# Patient Record
Sex: Male | Born: 1960 | Race: White | Hispanic: No | Marital: Married | Smoking: Current some day smoker
Health system: Southern US, Community
[De-identification: ages and names within clinical notes are randomized; demographics above are authoritative.]

## PROBLEM LIST (undated history)

## (undated) DIAGNOSIS — K219 Gastro-esophageal reflux disease without esophagitis: Secondary | ICD-10-CM

## (undated) DIAGNOSIS — IMO0001 Reserved for inherently not codable concepts without codable children: Secondary | ICD-10-CM

## (undated) DIAGNOSIS — K4090 Unilateral inguinal hernia, without obstruction or gangrene, not specified as recurrent: Secondary | ICD-10-CM

## (undated) DIAGNOSIS — R030 Elevated blood-pressure reading, without diagnosis of hypertension: Secondary | ICD-10-CM

## (undated) HISTORY — DX: Unilateral inguinal hernia, without obstruction or gangrene, not specified as recurrent: K40.90

## (undated) SURGERY — REPAIR, HERNIA, INGUINAL, LAPAROSCOPIC
Anesthesia: General | Laterality: Right

---
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
## 2005-07-18 ENCOUNTER — Emergency Department: Payer: Self-pay | Admitting: Unknown Physician Specialty

## 2006-05-19 ENCOUNTER — Emergency Department: Payer: Self-pay | Admitting: Emergency Medicine

## 2007-02-21 ENCOUNTER — Emergency Department: Payer: Self-pay | Admitting: Unknown Physician Specialty
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
## 2009-06-07 ENCOUNTER — Emergency Department: Payer: Self-pay | Admitting: Emergency Medicine

## 2009-12-08 ENCOUNTER — Emergency Department: Payer: Self-pay | Admitting: Emergency Medicine

## 2010-12-31 ENCOUNTER — Emergency Department: Payer: Self-pay | Admitting: Emergency Medicine
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
## 2012-06-18 IMAGING — CR DG CHEST 1V PORT
1 series · 1 of 1 positions shown · non-contrast
Comparison: none

REASON FOR EXAM: Chest Pain
COMMENTS:

PROCEDURE:     DXR - DXR PORTABLE CHEST SINGLE VIEW  - December 31, 2010  [DATE]
RESULT:     The lungs are clear. The cardiovascular structures are
unremarkable. The chest is stable from 02/21/2007.

[view not recorded]
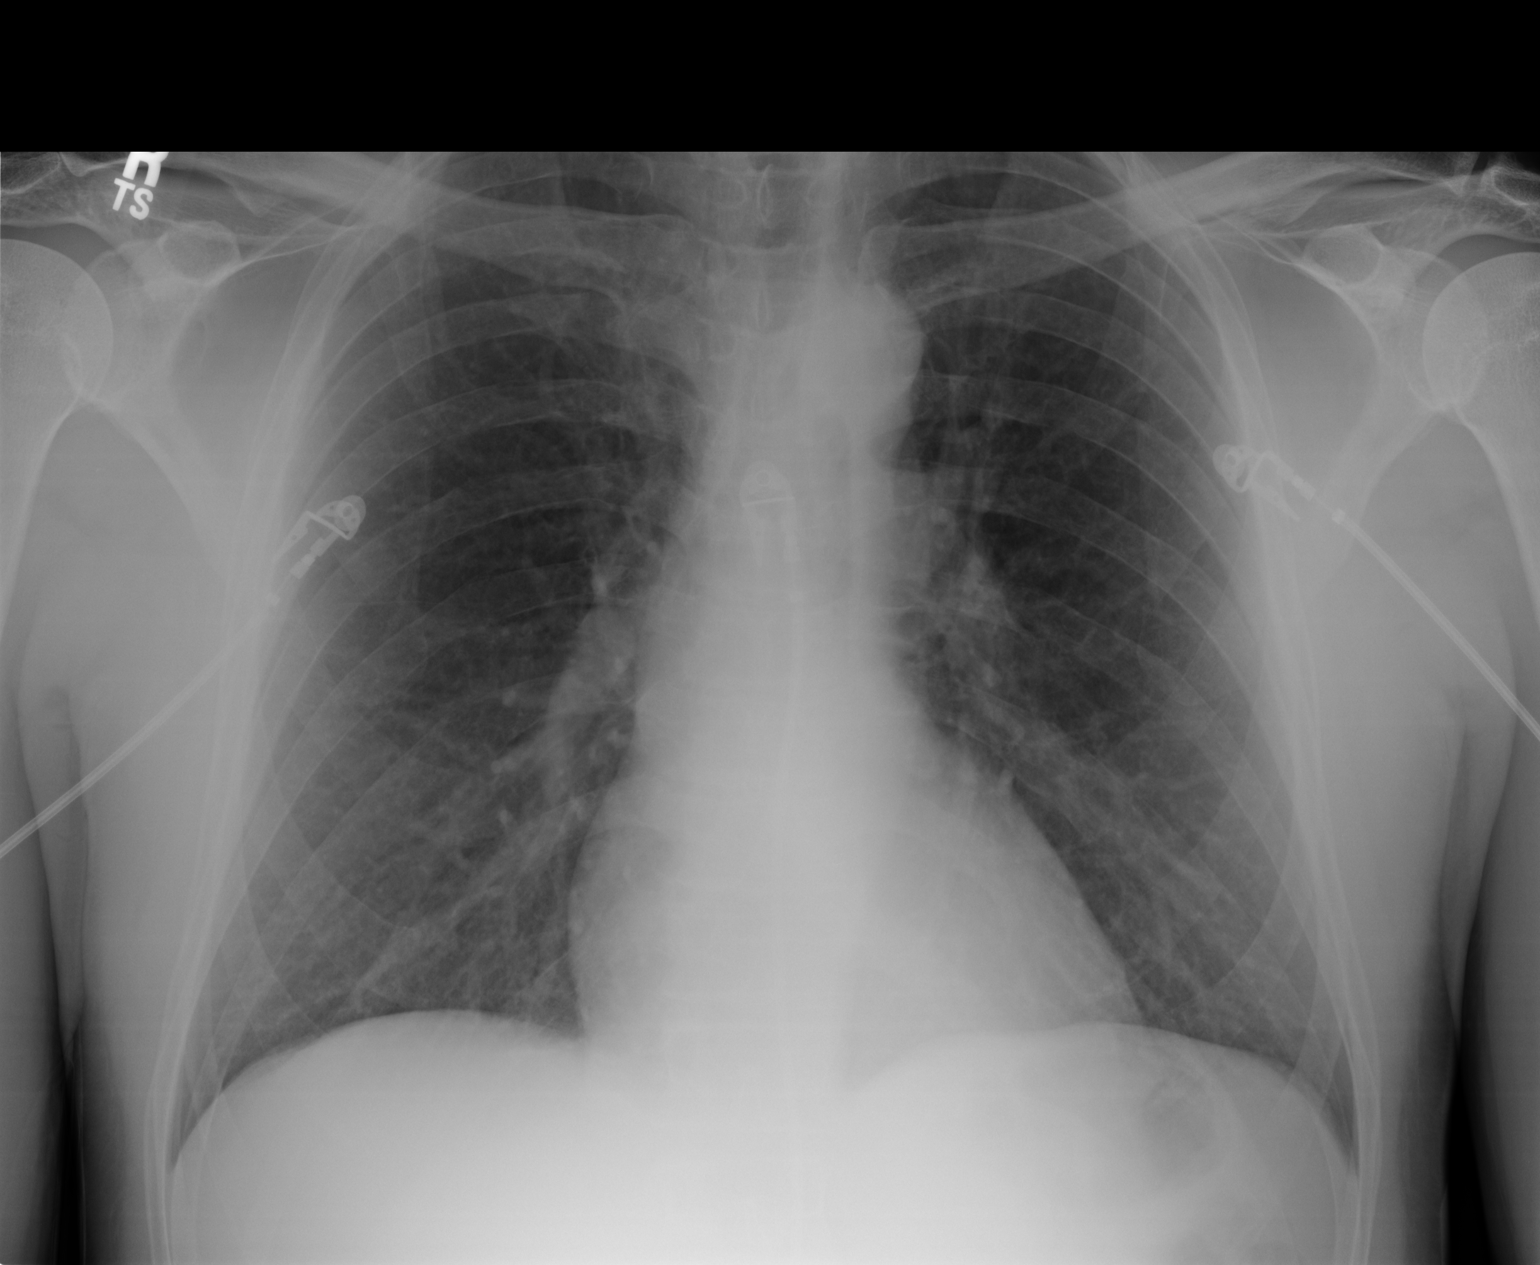

[1 of 1 positions shown; findings below may reference images not displayed]

IMPRESSION: No acute cardiopulmonary disease.

## 2013-07-23 ENCOUNTER — Emergency Department: Payer: Self-pay | Admitting: Emergency Medicine

## 2015-05-07 ENCOUNTER — Telehealth: Payer: Self-pay

## 2015-05-07 NOTE — Telephone Encounter (Signed)
Called patient at this time to see if patient has seen a provider regarding current Hernia in order to get records sent over as I am not finding any in [facility].   Home Phone is disconnected and Emergency contact did not answer. Unable to leave VM.

## 2015-05-08 ENCOUNTER — Encounter: Payer: Self-pay | Admitting: Surgery

## 2015-05-08 ENCOUNTER — Other Ambulatory Visit
Admission: RE | Admit: 2015-05-08 | Discharge: 2015-05-08 | Disposition: A | Discharge status: Home / Self Care | Payer: No Typology Code available for payment source | Source: Ambulatory Visit | Attending: Surgery | Admitting: Surgery

## 2015-05-08 ENCOUNTER — Ambulatory Visit (INDEPENDENT_AMBULATORY_CARE_PROVIDER_SITE_OTHER): Payer: PRIVATE HEALTH INSURANCE | Admitting: Surgery

## 2015-05-08 VITALS — BP 152/93 | HR 71 | Temp 97.9°F | Ht 75.0 in | Wt 172.2 lb

## 2015-05-08 DIAGNOSIS — K4090 Unilateral inguinal hernia, without obstruction or gangrene, not specified as recurrent: Secondary | ICD-10-CM | POA: Insufficient documentation

## 2015-05-08 DIAGNOSIS — K469 Unspecified abdominal hernia without obstruction or gangrene: Secondary | ICD-10-CM | POA: Insufficient documentation

## 2015-05-08 HISTORY — DX: Unilateral inguinal hernia, without obstruction or gangrene, not specified as recurrent: K40.90

## 2015-05-08 LAB — CBC WITH DIFFERENTIAL/PLATELET
BASOS ABS: 0.1 10*3/uL (ref 0–0.1)
Basophils Relative: 1 %
Eosinophils Absolute: 0.2 10*3/uL (ref 0–0.7)
Eosinophils Relative: 4 %
HEMATOCRIT: 45.9 % (ref 40.0–52.0)
Hemoglobin: 15.3 g/dL (ref 13.0–18.0)
LYMPHS ABS: 1.7 10*3/uL (ref 1.0–3.6)
LYMPHS PCT: 32 %
MCH: 34.2 pg — ABNORMAL HIGH (ref 26.0–34.0)
MCHC: 33.3 g/dL (ref 32.0–36.0)
MCV: 102.9 fL — ABNORMAL HIGH (ref 80.0–100.0)
MONO ABS: 0.7 10*3/uL (ref 0.2–1.0)
Monocytes Relative: 12 %
NEUTROS ABS: 2.7 10*3/uL (ref 1.4–6.5)
Neutrophils Relative %: 51 %
Platelets: 230 10*3/uL (ref 150–440)
RBC: 4.46 MIL/uL (ref 4.40–5.90)
RDW: 14.1 % (ref 11.5–14.5)
WBC: 5.4 10*3/uL (ref 3.8–10.6)

## 2015-05-08 LAB — BASIC METABOLIC PANEL
ANION GAP: 8 (ref 5–15)
BUN: 14 mg/dL (ref 6–20)
CHLORIDE: 103 mmol/L (ref 101–111)
CO2: 26 mmol/L (ref 22–32)
CREATININE: 0.91 mg/dL (ref 0.61–1.24)
Calcium: 9.4 mg/dL (ref 8.9–10.3)
GFR calc Af Amer: >60 mL/min (ref >60)
GFR calc non Af Amer: >60 mL/min (ref >60)
Glucose, Bld: 98 mg/dL (ref 65–99)
Potassium: 4.2 mmol/L (ref 3.5–5.1)
Sodium: 137 mmol/L (ref 135–145)

## 2015-05-08 MED ORDER — ENOXAPARIN SODIUM 150 MG/ML ~~LOC~~ SOLN: 40.0000 mg | SUBCUTANEOUS | Status: DC

## 2015-05-08 MED ORDER — CEFAZOLIN (ANCEF) 1 G IV SOLR: 1.0000 g | INTRAVENOUS | Status: AC

## 2015-05-08 NOTE — H&P (Signed)
Subjective:     Patient ID: Spencer Lynn, male   DOB: 14-Aug-1961, 54 y.o.   MRN: [id number]  HPI   54 year old otherwise healthy white male with a one pack per day cigarette smoking history presents to the office following emergency room visit 1 year ago with a symptomatic right inguinal hernia. Since then he's obtain insurance. He's been wearing a hernia belt some success.  Of note the patient has had 2 prior left inguinal herniorrhaphies 1 at the time of his birth and then one other repair at the age of 72. No previous right inguinal hernia repairs.  There is been no signs or symptoms of incarceration of hernia. He continues to work and is active. No other past medical histories.  Review of Systems  Constitutional: Negative.   HENT: Negative.   Eyes: Negative.   Respiratory: Negative.   Cardiovascular: Negative.   Gastrointestinal: Negative.   Endocrine: Negative.   Genitourinary: Positive for urgency and scrotal swelling. Negative for dysuria, frequency, hematuria, flank pain, decreased urine volume, enuresis and testicular pain.  Musculoskeletal: Negative.   Neurological: Negative.   Hematological: Negative.   Psychiatric/Behavioral: Negative.        Objective:   Physical Exam  Constitutional: He is oriented to person, place, and time. He appears well-developed and well-nourished.  HENT:  Head: Normocephalic and atraumatic.  Eyes: Conjunctivae and EOM are normal. Pupils are equal, round, and reactive to light.  Neck: Neck supple.  Cardiovascular: Regular rhythm and normal heart sounds.   Pulmonary/Chest: Effort normal and breath sounds normal. No respiratory distress.  Abdominal: A hernia is present. Hernia confirmed positive in the right inguinal area. Hernia confirmed negative in the left inguinal area.  Genitourinary: Penis normal.    Circumcised.  Neurological: He is alert and oriented to person, place, and time.  Skin: Skin is warm and dry.  Psychiatric: He has a  normal mood and affect. His behavior is normal. Judgment normal.       Assessment:     Healthy 54 year old smoker with a symptomatic right moderate sized inguinal hernia.    Plan:      I discussed with him various techniques of repair. He is interested in the one with the quickest recovery. We discussed robotically assisted right inguinal herniorrhaphy with mesh. I discussed with him the procedure. I discussed with him the risk of recurrence being less than 1% risk of infection less than 1% risk of chronic ilioinguinal pain being much less than 1% and all his questions were answered. I did describe to him needing at least 3 weeks off work for recovery. She agrees and wishes to proceed with this plan of treatment.

## 2015-05-08 NOTE — Addendum Note (Signed)
Addended by: Natale Lay on: 05/08/2015 01:27 PM   Modules accepted: Level of Service

## 2015-05-08 NOTE — Patient Instructions (Signed)
You will be scheduled for Surgery on 05/16/15 with Dr. Egbert Garibaldi to have your hernia repaired.  You will need to have labs done. Please go to [facility] at this time to have these completed.

## 2015-05-09 NOTE — Addendum Note (Signed)
Addended by: Natale Lay on: 05/09/2015 03:57 PM   Modules accepted: Orders, Level of Service

## 2015-05-14 ENCOUNTER — Inpatient Hospital Stay: Admission: RE | Admit: 2015-05-14 | Payer: No Typology Code available for payment source | Source: Ambulatory Visit

## 2015-05-14 ENCOUNTER — Telehealth: Payer: Self-pay | Admitting: Surgery

## 2015-05-14 ENCOUNTER — Encounter: Payer: Self-pay | Admitting: *Deleted

## 2015-05-14 DIAGNOSIS — Z9889 Other specified postprocedural states: Secondary | ICD-10-CM | POA: Diagnosis not present

## 2015-05-14 DIAGNOSIS — K4090 Unilateral inguinal hernia, without obstruction or gangrene, not specified as recurrent: Secondary | ICD-10-CM | POA: Diagnosis not present

## 2015-05-14 DIAGNOSIS — F17210 Nicotine dependence, cigarettes, uncomplicated: Secondary | ICD-10-CM | POA: Diagnosis not present

## 2015-05-14 NOTE — Telephone Encounter (Signed)
Pt advised of pre op date/time and sx date. Sx: 05/16/15 with Dr Ree Kida assisted right inguinal hernia repair--11:45am.  Pre op: Office-05/14/15 @ 11:45am.

## 2015-05-14 NOTE — Patient Instructions (Signed)
  Your procedure is scheduled on: 05-16-15 Report to [facility] 2ND FLOOR To find out your arrival time please call [phone] between 1PM - 3PM on 05-15-15  Remember: Instructions that are not followed completely may result in serious medical risk, up to and including death, or upon the discretion of your surgeon and anesthesiologist your surgery may need to be rescheduled.    _X___ 1. Do not eat food or drink liquids after midnight. No gum chewing or hard candies.     _X___ 2. No Alcohol for 24 hours before or after surgery.   ____ 3. Bring all medications with you on the day of surgery if instructed.    ____ 4. Notify your doctor if there is any change in your medical condition     (cold, fever, infections).     Do not wear jewelry, make-up, hairpins, clips or nail polish.  Do not wear lotions, powders, or perfumes. You may wear deodorant.  Do not shave 48 hours prior to surgery. Men may shave face and neck.  Do not bring valuables to the hospital.    [facility] any belongings or valuables.               Contacts, dentures or bridgework may not be worn into surgery.  Leave your suitcase in the car. After surgery it may be brought to your room.  For patients admitted to the hospital, discharge time is determined by your treatment team.   Patients discharged the day of surgery will not be allowed to drive home.   Please read over the following fact sheets that you were given:     ____ Take these medicines the morning of surgery with A SIP OF WATER:    1. NONE  2.   3.   4.  5.  6.  ____ Fleet Enema (as directed)   _X___ Use CHG Soap as directed  ____ Use inhalers on the day of surgery  ____ Stop metformin 2 days prior to surgery    ____ Take 1/2 of usual insulin dose the night before surgery and none on the morning of surgery.   ____ Stop Coumadin/Plavix/aspirin-N/A  ____ Stop Anti-inflammatories-NO NSAIDS OR ASPIRIN   PRODUCTS-TYLENOL OK   ____ Stop supplements until after surgery.    ____ Bring C-Pap to the hospital.

## 2015-05-16 ENCOUNTER — Ambulatory Visit: Payer: No Typology Code available for payment source | Admitting: Anesthesiology

## 2015-05-16 ENCOUNTER — Encounter: Payer: Self-pay | Admitting: *Deleted

## 2015-05-16 ENCOUNTER — Ambulatory Visit
Admission: RE | Admit: 2015-05-16 | Discharge: 2015-05-16 | Disposition: A | Discharge status: Home / Self Care | Payer: No Typology Code available for payment source | Source: Ambulatory Visit | Attending: Surgery | Admitting: Surgery

## 2015-05-16 ENCOUNTER — Telehealth: Payer: Self-pay | Admitting: Surgery

## 2015-05-16 ENCOUNTER — Encounter
Admission: RE | Disposition: A | Discharge status: Home / Self Care | Payer: Self-pay | Source: Ambulatory Visit | Attending: Surgery

## 2015-05-16 DIAGNOSIS — Z9889 Other specified postprocedural states: Secondary | ICD-10-CM | POA: Insufficient documentation

## 2015-05-16 DIAGNOSIS — K4090 Unilateral inguinal hernia, without obstruction or gangrene, not specified as recurrent: Secondary | ICD-10-CM | POA: Diagnosis not present

## 2015-05-16 DIAGNOSIS — F17210 Nicotine dependence, cigarettes, uncomplicated: Secondary | ICD-10-CM | POA: Insufficient documentation

## 2015-05-16 HISTORY — DX: Elevated blood-pressure reading, without diagnosis of hypertension: R03.0

## 2015-05-16 HISTORY — DX: Reserved for inherently not codable concepts without codable children: IMO0001

## 2015-05-16 HISTORY — PX: INGUINAL HERNIA REPAIR: SHX194

## 2015-05-16 HISTORY — DX: Gastro-esophageal reflux disease without esophagitis: K21.9

## 2015-05-16 SURGERY — REPAIR, HERNIA, INGUINAL, ADULT
Anesthesia: General | Site: Groin | Laterality: Right | Wound class: Clean

## 2015-05-16 MED ORDER — FAMOTIDINE 20 MG PO TABS
ORAL_TABLET | ORAL | Status: AC
  Administered 2015-05-16: 20 mg via ORAL
  Filled 2015-05-16: qty 1

## 2015-05-16 MED ORDER — LACTATED RINGERS IV SOLN
Freq: Once | INTRAVENOUS | Status: AC
  Administered 2015-05-16: 11:00:00 via INTRAVENOUS

## 2015-05-16 MED ORDER — LACTATED RINGERS IV SOLN
INTRAVENOUS | Status: DC | PRN
  Administered 2015-05-16: 12:00:00 via INTRAVENOUS

## 2015-05-16 MED ORDER — FAMOTIDINE 20 MG PO TABS
20.0000 mg | ORAL_TABLET | Freq: Once | ORAL | Status: AC
  Administered 2015-05-16: 20 mg via ORAL

## 2015-05-16 MED ORDER — ACETAMINOPHEN 10 MG/ML IV SOLN
INTRAVENOUS | Status: DC | PRN
  Administered 2015-05-16: 1000 mg via INTRAVENOUS

## 2015-05-16 MED ORDER — NEOMYCIN-POLYMYXIN B GU 40-200000 IR SOLN
Status: DC | PRN
  Administered 2015-05-16: 2 mL

## 2015-05-16 MED ORDER — ENOXAPARIN SODIUM 40 MG/0.4ML ~~LOC~~ SOLN
40.0000 mg | Freq: Once | SUBCUTANEOUS | Status: AC
  Administered 2015-05-16: 40 mg via SUBCUTANEOUS
  Filled 2015-05-16: qty 0.4

## 2015-05-16 MED ORDER — BUPIVACAINE HCL 0.25 % IJ SOLN
INTRAMUSCULAR | Status: DC | PRN
  Administered 2015-05-16: 30 mL

## 2015-05-16 MED ORDER — NEOMYCIN-POLYMYXIN B GU 40-200000 IR SOLN
Status: AC
  Filled 2015-05-16: qty 2

## 2015-05-16 MED ORDER — PROPOFOL 10 MG/ML IV BOLUS
INTRAVENOUS | Status: DC | PRN
  Administered 2015-05-16: 200 mg via INTRAVENOUS
  Administered 2015-05-16: 100 mg via INTRAVENOUS

## 2015-05-16 MED ORDER — ACETAMINOPHEN 10 MG/ML IV SOLN
INTRAVENOUS | Status: AC
  Filled 2015-05-16: qty 100

## 2015-05-16 MED ORDER — KETOROLAC TROMETHAMINE 30 MG/ML IJ SOLN
INTRAMUSCULAR | Status: DC | PRN
  Administered 2015-05-16: 30 mg via INTRAVENOUS

## 2015-05-16 MED ORDER — FENTANYL CITRATE (PF) 100 MCG/2ML IJ SOLN
INTRAMUSCULAR | Status: DC | PRN
  Administered 2015-05-16: 50 ug via INTRAVENOUS
  Administered 2015-05-16: 100 ug via INTRAVENOUS
  Administered 2015-05-16: 50 ug via INTRAVENOUS

## 2015-05-16 MED ORDER — FENTANYL CITRATE (PF) 100 MCG/2ML IJ SOLN: 25.0000 ug | INTRAMUSCULAR | Status: DC | PRN

## 2015-05-16 MED ORDER — CEFAZOLIN SODIUM-DEXTROSE 2-3 GM-% IV SOLR
2.0000 g | INTRAVENOUS | Status: AC
  Administered 2015-05-16: 2 g via INTRAVENOUS

## 2015-05-16 MED ORDER — MIDAZOLAM HCL 2 MG/2ML IJ SOLN
INTRAMUSCULAR | Status: DC | PRN
  Administered 2015-05-16: 2 mg via INTRAVENOUS

## 2015-05-16 MED ORDER — CHLORHEXIDINE GLUCONATE 4 % EX LIQD: 1.0000 "application " | Freq: Once | CUTANEOUS | Status: DC

## 2015-05-16 MED ORDER — NEOSTIGMINE METHYLSULFATE 10 MG/10ML IV SOLN
INTRAVENOUS | Status: DC | PRN
  Administered 2015-05-16: 4 mg via INTRAVENOUS

## 2015-05-16 MED ORDER — BUPIVACAINE HCL (PF) 0.25 % IJ SOLN
INTRAMUSCULAR | Status: AC
  Filled 2015-05-16: qty 30

## 2015-05-16 MED ORDER — ROCURONIUM BROMIDE 100 MG/10ML IV SOLN
INTRAVENOUS | Status: DC | PRN
  Administered 2015-05-16: 50 mg via INTRAVENOUS
  Administered 2015-05-16: 20 mg via INTRAVENOUS

## 2015-05-16 MED ORDER — ONDANSETRON HCL 4 MG/2ML IJ SOLN: 4.0000 mg | Freq: Once | INTRAMUSCULAR | Status: DC | PRN

## 2015-05-16 MED ORDER — ONDANSETRON HCL 4 MG/2ML IJ SOLN
INTRAMUSCULAR | Status: DC | PRN
  Administered 2015-05-16: 4 mg via INTRAVENOUS

## 2015-05-16 MED ORDER — LIDOCAINE HCL (CARDIAC) 20 MG/ML IV SOLN
INTRAVENOUS | Status: DC | PRN
  Administered 2015-05-16: 100 mg via INTRAVENOUS

## 2015-05-16 MED ORDER — CEFAZOLIN SODIUM-DEXTROSE 2-3 GM-% IV SOLR
INTRAVENOUS | Status: AC
  Administered 2015-05-16: 2 g via INTRAVENOUS
  Filled 2015-05-16: qty 50

## 2015-05-16 MED ORDER — EPHEDRINE SULFATE 50 MG/ML IJ SOLN
INTRAMUSCULAR | Status: DC | PRN
  Administered 2015-05-16: 10 mg via INTRAVENOUS

## 2015-05-16 MED ORDER — GLYCOPYRROLATE 0.2 MG/ML IJ SOLN
INTRAMUSCULAR | Status: DC | PRN
  Administered 2015-05-16: 0.6 mg via INTRAVENOUS

## 2015-05-16 MED ORDER — HYDROCODONE-ACETAMINOPHEN 5-325 MG PO TABS: 1.0000 | ORAL_TABLET | Freq: Four times a day (QID) | ORAL | Status: DC | PRN

## 2015-05-16 SURGICAL SUPPLY — 57 items
BENZOIN TINCTURE PRP APPL 2/3 (GAUZE/BANDAGES/DRESSINGS) ×4 IMPLANT
CANISTER SUCT 1200ML W/VALVE (MISCELLANEOUS) ×4 IMPLANT
CANNULA SEALS 8.5MM (CANNULA)
CATH TRAY 16F METER LATEX (MISCELLANEOUS) ×4 IMPLANT
CHLORAPREP W/TINT 26ML (MISCELLANEOUS) ×4 IMPLANT
CLOSURE WOUND 1/2 X4 (GAUZE/BANDAGES/DRESSINGS) ×1
CORD BIP STRL DISP 12FT (MISCELLANEOUS) IMPLANT
COVER TIP SHEARS 8 DVNC (MISCELLANEOUS) ×2 IMPLANT
COVER TIP SHEARS 8MM DA VINCI (MISCELLANEOUS) ×2
DEFOGGER SCOPE WARMER CLEARIFY (MISCELLANEOUS) IMPLANT
DRAIN PENROSE 1/4X12 LTX (DRAIN) ×4 IMPLANT
DRAPE 3 ARM ACCESS DA VINCI (DRAPES)
DRAPE 3 ARM ACCESS DVNC (DRAPES) IMPLANT
DRAPE SHEET LG 3/4 BI-LAMINATE (DRAPES) ×8 IMPLANT
DRESSING TELFA 4X3 1S ST N-ADH (GAUZE/BANDAGES/DRESSINGS) ×4 IMPLANT
DRSG TEGADERM 2-3/8X2-3/4 SM (GAUZE/BANDAGES/DRESSINGS) IMPLANT
GLOVE BIO SURGEON STRL SZ7.5 (GLOVE) ×8 IMPLANT
GOWN STRL REUS W/ TWL LRG LVL3 (GOWN DISPOSABLE) ×8 IMPLANT
GOWN STRL REUS W/ TWL XL LVL3 (GOWN DISPOSABLE) ×4 IMPLANT
GOWN STRL REUS W/TWL LRG LVL3 (GOWN DISPOSABLE) ×8
GOWN STRL REUS W/TWL XL LVL3 (GOWN DISPOSABLE) ×4
IRRIGATION STRYKERFLOW (MISCELLANEOUS) IMPLANT
IRRIGATOR STRYKERFLOW (MISCELLANEOUS)
IV NS 1000ML (IV SOLUTION) ×2
IV NS 1000ML BAXH (IV SOLUTION) ×2 IMPLANT
KIT RM TURNOVER STRD PROC AR (KITS) ×4 IMPLANT
LABEL OR SOLS (LABEL) ×4 IMPLANT
MESH HERNIA 10X14 SHEET (Mesh General) ×4 IMPLANT
NDL SAFETY 25GX1.5 (NEEDLE) ×4 IMPLANT
NEEDLE FILTER BLUNT 18X 1/2SAF (NEEDLE) ×2
NEEDLE FILTER BLUNT 18X1 1/2 (NEEDLE) ×2 IMPLANT
NS IRRIG 500ML POUR BTL (IV SOLUTION) ×4 IMPLANT
PACK LAP CHOLECYSTECTOMY (MISCELLANEOUS) ×4 IMPLANT
PAD GROUND ADULT SPLIT (MISCELLANEOUS) ×4 IMPLANT
PAD TRENDELENBURG OR TABLE (MISCELLANEOUS) ×4 IMPLANT
SCISSORS METZENBAUM CVD 33 (INSTRUMENTS) IMPLANT
SEAL CANN 8.5 DVNC (CANNULA) IMPLANT
SOLUTION ELECTROLUBE (MISCELLANEOUS) ×4 IMPLANT
SPONGE KITTNER 5P (MISCELLANEOUS) ×4 IMPLANT
STRIP CLOSURE SKIN 1/2X4 (GAUZE/BANDAGES/DRESSINGS) ×3 IMPLANT
SUT ETHILON 4-0 (SUTURE) ×2
SUT ETHILON 4-0 FS2 18XMFL BLK (SUTURE) ×2
SUT QUILL 0 20X36 (SUTURE) ×8 IMPLANT
SUT SILK 0 SH 30 (SUTURE) ×8 IMPLANT
SUT VIC AB 0 CT2 27 (SUTURE) ×12 IMPLANT
SUT VIC AB 0 UR5 27 (SUTURE) ×4 IMPLANT
SUT VIC AB 2-0 SH 27 (SUTURE) ×6
SUT VIC AB 2-0 SH 27XBRD (SUTURE) ×6 IMPLANT
SUT VIC AB 4-0 FS2 27 (SUTURE) ×8 IMPLANT
SUT VICRYL 0 27 CT2 27 ABS (SUTURE) ×12 IMPLANT
SUT VICRYL 0 AB UR-6 (SUTURE) ×8 IMPLANT
SUTURE ETHLN 4-0 FS2 18XMF BLK (SUTURE) ×2 IMPLANT
SYR 3ML LL SCALE MARK (SYRINGE) ×4 IMPLANT
TACKER 5MM HERNIA 3.5CML NAB (ENDOMECHANICALS) ×4 IMPLANT
TROCAR 130MM GELPORT  DAV (MISCELLANEOUS) IMPLANT
TROCAR Z-THREAD FIOS 11X100 BL (TROCAR) IMPLANT
TUBING INSUFFLATOR HI FLOW (MISCELLANEOUS) IMPLANT

## 2015-05-16 NOTE — H&P (Signed)
  Date of Initial H&P:05/08/2015  History reviewed, patient examined, no change in status, stable for surgery. 

## 2015-05-16 NOTE — Op Note (Signed)
05/16/2015  1:40 PM  PATIENT:  Spencer Lynn  54 y.o. male  PRE-OPERATIVE DIAGNOSIS:  UNILATERAL INGUINAL HERNIA WITHOUT OBSTRUCTION   POST-OPERATIVE DIAGNOSIS:  UNILATERAL INGUINAL HERNIA WITHOUT OBSTRUCTION   PROCEDURE:  Procedure(s): HERNIA REPAIR INGUINAL ADULT (Right), open with mesh.  SURGEON:  Surgeon(s) and Role:    * Natale LayMark Shanaia Sievers, MD - Primary  ANESTHESIA: general  SPECIMEN: hernia sac.  EBL: 25 cc.  Findings: Large indirect inguinal hernia sac was thickened case.   Description of procedure:   With the patient in the supine position general endotracheal anesthesia was induced. A Foley catheter was attempted to be placed. However despite multiple different catheters and the use of coud catheter I could not obtain catheterization of his bladder. I then went spoke to his wife in the holding area and told her that we would proceed with an open repair rather than a laparoscopic repair utilizing Engineer, building servicesda Vinci robot. She was in agreement.   The patient's right groin was sterilely prepped and draped with chloro prep followed by Collier FlowersIoban. A timeout was observed. Curvilinear incision was fashion the right groin above the inguinal ligament. Skin was divided with scalpel Scarpa's fascia with electrocautery the external oblique aponeurosis was identified. It was incised laterally with scalpel and carried through the external ring with sharp dissection the course of the ilioinguinal nerve was identified and preserved.   Spermatic cord was elevated off the pubic tubercle and elevated with a Penrose drain. There was no signs of a direct inguinal hernia. Dissection of the cord structures demonstrated a large thickened chronically appearing hernia sac which was tediously separated from the vas and vessels. It was opened distally. There was no sign of femoral hernia with manipulation of the peritoneal cavity. The hernia sac was doubly ligate #0 silk suture. It then retracted back into the abdomen. A  large precut polypropylene mesh was brought onto the field soaked in antibiotic solution and inserted into the defect in the Lichtenstein technique securing it to the pubic tubercle, shelving edge of the inguinal ligament, conjoined tendon and internal oblique aponeurosis.  The external ring was re-created by crisscrossing the tails of the mesh. The two tails of the mesh were tucked underneath the external oblique aponeurosis laterally.  This was accomplished utilizing 2-0 Vicryls suture. A single stitch of #0-0 silk suture was placed from mesh to mesh to tighten the internal ring. A total of 30 cc's of 0.25% plain Marcaine was then infiltrated along the operative field and including 10 cc in a field block of the ilioinguinal nerve. The external oblique aponeurosis was then reapproximated utilizing a running number 0 Vicryls suture. 3-0 Vicryl suture was used in a running fashion to reapproximate Scarpa's fascia. 4-0 Monocryl was used in subcuticular fashion to reapproximate skin edges. Benzoin Steri-Strips Telfa and Tegaderm were then applied. The patient was subsequently extubated and taken to the recovery room in stable and satisfactory condition by anesthesia services.

## 2015-05-16 NOTE — Discharge Instructions (Signed)
DISCHARGE INSTRUCTIONS TO PATIENT  REMINDER:   Carry a list of your medications and allergies with you at all times  Call your pharmacy at least 1 week in advance to refill prescriptions  Do not mix any prescribed pain medicine with alcohol  Do not drive any motor vehicles while taking pain medication.  Take medications with food.  Do not retake a pain medication if you vomit after taking it.  Activity: no lifting more than 15 pounds until instructed by your doctor.   Dressing Care Instruction (if applicable):              Remove operative dressings in 48 hours.  May Shower-  Call office if any questions regarding this activity.  Dry Dressing as needed to operative site.  Drain care instructions provided to you in the hospital.   Follow-up appointments (date to return to physician): Call for appointment with Dr. Sherri Rad, MD at [phone] or [phone]  If need MD on call after hours and on weekends call Hospital operator at [phone] as ask to speak to Surgeon on call for [facility].  Call Surgeon if you have:  Temperature greater than 100.4  Persistent nausea and vomiting  Severe uncontrolled pain  Redness, tenderness, or signs of infection (pain, swelling, redness, odor or green/yellow discharge around the site)  Difficulty breathing, headache or visual disturbances  Hives  Persistent dizziness or light-headedness  Extreme fatigue  Any other questions or concerns you may have after discharge  In an emergency, call 911 or go to an Emergency Department at a nearby hospital  Diet:  Resume your usual diet.  Avoid spicy, greasy or heavy foods.  If you have nausea or vomiting, go back to liquids.  If you cannot keep liquids down, call your doctor.  Avoid alcohol consumption while on prescription pain medications. Good nutrition promotes healing. Increase fiber and fluids.     I understand and acknowledge receipt of the above instructions.                                                                                                                                         Patient or Guardian Signature                                                                    Date/Time  Physician's or R.N.'s Signature                                                                  Date/Time  The discharge instructions have been reviewed with the patient and/or Family Member/Parent/Guardian.  Patient and/or Family Member/Parent/Guardian signed and retained a printed copy.                                                                                                     AMBULATORY SURGERY  DISCHARGE INSTRUCTIONS   1) The drugs that you were given will stay in your system until tomorrow so for the next 24 hours you should not:  A) Drive an automobile B) Make any legal decisions C) Drink any alcoholic beverage   2) You may resume regular meals tomorrow.  Today it is better to start with liquids and gradually work up to solid foods.  You may eat anything you prefer, but it is better to start with liquids, then soup and crackers, and gradually work up to solid foods.   3) Please notify your doctor immediately if you have any unusual bleeding, trouble breathing, redness and pain at the surgery site, drainage, fever, or pain not relieved by medication. 4)   5) Your post-operative visit with Dr.                                     is: Date:                        Time:    Please call to schedule your post-operative visit.  6) Additional Instructions:

## 2015-05-16 NOTE — Telephone Encounter (Signed)
No auth/precert is req for outpatient procedures [facility] [employer].  [phone] REF# [id number]

## 2015-05-16 NOTE — Anesthesia Procedure Notes (Signed)
Procedure Name: Intubation Date/Time: 05/16/2015 11:39 AM Performed by: Irving BurtonBACHICH, Yisell Sprunger Pre-anesthesia Checklist: Patient identified, Emergency Drugs available, Suction available and Patient being monitored Patient Re-evaluated:Patient Re-evaluated prior to inductionOxygen Delivery Method: Circle system utilized Preoxygenation: Pre-oxygenation with 100% oxygen Intubation Type: IV induction Laryngoscope Size: Mac and 4 Grade View: Grade II Tube type: Oral Number of attempts: 2 Airway Equipment and Method: Patient positioned with wedge pillow and Stylet Placement Confirmation: ETT inserted through vocal cords under direct vision,  positive ETCO2 and breath sounds checked- equal and bilateral Secured at: 23 cm Tube secured with: Tape Dental Injury: Teeth and Oropharynx as per pre-operative assessment

## 2015-05-16 NOTE — OR Nursing (Signed)
Patient was a difficult foley placement after trying multiple times with coude foley.  MD preferred to change to open hernia repair.  Talked to patient's family and family ok with decision.

## 2015-05-16 NOTE — Anesthesia Preprocedure Evaluation (Signed)
Anesthesia Evaluation  Patient identified by MRN, date of birth, ID band Patient awake    Reviewed: Allergy & Precautions, NPO status   History of Anesthesia Complications Negative for: history of anesthetic complications  Airway Mallampati: II       Dental no notable dental hx.    Pulmonary COPDCurrent Smoker,  + rhonchi         Cardiovascular negative cardio ROS  Rate:Tachycardia     Neuro/Psych Anxiety negative neurological ROS     GI/Hepatic Neg liver ROS, GERD-  ,  Endo/Other  negative endocrine ROS  Renal/GU negative Renal ROS  negative genitourinary   Musculoskeletal negative musculoskeletal ROS (+)   Abdominal Normal abdominal exam  (+)   Peds negative pediatric ROS (+)  Hematology negative hematology ROS (+)   Anesthesia Other Findings   Reproductive/Obstetrics negative OB ROS                             Anesthesia Physical Anesthesia Plan  ASA: II  Anesthesia Plan: General   Post-op Pain Management:    Induction: Intravenous  Airway Management Planned: Oral ETT  Additional Equipment:   Intra-op Plan:   Post-operative Plan: Extubation in OR  Informed Consent: I have reviewed the patients History and Physical, chart, labs and discussed the procedure including the risks, benefits and alternatives for the proposed anesthesia with the patient or authorized representative who has indicated his/her understanding and acceptance.     Plan Discussed with: CRNA  Anesthesia Plan Comments:         Anesthesia Quick Evaluation

## 2015-05-16 NOTE — Anesthesia Postprocedure Evaluation (Signed)
  Anesthesia Post-op Note  Patient: Spencer Lynn  Procedure(s) Performed: Procedure(s): HERNIA REPAIR INGUINAL ADULT (Right)  Anesthesia type:General  Patient location: PACU  Post pain: Pain level controlled  Post assessment: Post-op Vital signs reviewed, Patient's Cardiovascular Status Stable, Respiratory Function Stable, Patent Airway and No signs of Nausea or vomiting  Post vital signs: Reviewed and stable  Last Vitals:  Filed Vitals:   05/16/15 1450  BP: 146/95  Pulse: 58  Temp: 36.2 C  Resp: 16    Level of consciousness: awake, alert  and patient cooperative  Complications: No apparent anesthesia complications

## 2015-05-16 NOTE — Transfer of Care (Signed)
Immediate Anesthesia Transfer of Care Note  Patient: Spencer Lynn  Procedure(s) Performed: Procedure(s): HERNIA REPAIR INGUINAL ADULT (Right)  Patient Location: PACU  Anesthesia Type:General  Level of Consciousness: awake  Airway & Oxygen Therapy: Patient Spontanous Breathing and Patient connected to face mask oxygen  Post-op Assessment: Post -op Vital signs reviewed and stable  Post vital signs: stable  Last Vitals:  Filed Vitals:   05/16/15 1346  BP: 132/95  Pulse: 60  Temp: 37 C  Resp: 20    Complications: No apparent anesthesia complications

## 2015-05-17 ENCOUNTER — Encounter: Payer: Self-pay | Admitting: Surgery

## 2015-05-17 LAB — SURGICAL PATHOLOGY

## 2015-05-24 ENCOUNTER — Encounter: Payer: Self-pay | Admitting: Surgery

## 2015-05-24 ENCOUNTER — Ambulatory Visit (INDEPENDENT_AMBULATORY_CARE_PROVIDER_SITE_OTHER): Payer: PRIVATE HEALTH INSURANCE | Admitting: Surgery

## 2015-05-24 VITALS — BP 138/96 | HR 74 | Temp 98.0°F | Resp 18 | Ht 75.0 in | Wt 168.0 lb

## 2015-05-24 DIAGNOSIS — K4090 Unilateral inguinal hernia, without obstruction or gangrene, not specified as recurrent: Secondary | ICD-10-CM

## 2015-05-24 NOTE — Progress Notes (Signed)
Outpatient postop visit  05/24/2015  Spencer Lynn is an 54 y.o. male.    Procedure: Open inguinal hernia repair  CC: Open right inguinal hernia repair  HPI: Patient had a in open right inguinal hernia repair as he could not be catheterized. A laparoscopic approach had been planned. Patient describes right groin pain as expected no nausea vomiting he states that his bowel movements in urinary habits have improved considerably. He asked for refill of Vicodin but can only afford 15 pills at a time.  Medications reviewed.    Physical Exam:  BP 138/96 mmHg  Pulse 74  Temp(Src) 98 F (36.7 C) (Oral)  Resp 18  Ht 6\' 3"  (1.905 m)  Wt 168 lb (76.204 kg)  BMI 21.00 kg/m2    PE: Healing wound no erythema no drainage Steri-Strips in place.    Assessment/Plan:  She doing very well I will refill his Vicodin and his requested volume of 15 due to finances. He will follow-up with Dr. Egbert GaribaldiBird in 1-2 weeks.  Lattie Hawichard E Cooper, MD, FACS

## 2015-06-01 ENCOUNTER — Other Ambulatory Visit: Payer: Self-pay | Admitting: Surgery

## 2015-06-01 NOTE — Telephone Encounter (Signed)
Called patient and explained that due to partial fill of prescription, his other 15 tabs should be at pharmacy. Asked patient to call pharmacy and if any other problems to please call our office.

## 2015-06-01 NOTE — Telephone Encounter (Signed)
When pt was last seen he was giving a script for 15 pain pills, He was going to get 30 but couldn't afford all 30, per pt he was told if he feels he needs the other 15 he can call back to the office Please call pt if he can pick up a script

## 2015-06-04 ENCOUNTER — Telehealth: Payer: Self-pay | Admitting: Surgery

## 2015-06-04 MED ORDER — HYDROCODONE-ACETAMINOPHEN 5-325 MG PO TABS: 1.0000 | ORAL_TABLET | Freq: Four times a day (QID) | ORAL | Status: AC | PRN

## 2015-06-04 NOTE — Telephone Encounter (Signed)
Prescription written and available in [place] office.  Called patient and explained that prescription was ready for pick-up.   He states that he will send his wife by to pick up this afternoon.

## 2015-06-04 NOTE — Telephone Encounter (Signed)
Wife called and said RX wasn't faxed or called in on Friday. It was for 15 pills of generic Vicoden. Pharmacy told her they do not hold precriptions and he will need another one.

## 2015-06-04 NOTE — Telephone Encounter (Signed)
Called Pharmacy at this time as this was completed as partial fill because patient did not have enough money to get the other 15. Called to see how to proceed with this prescripton.  No answer at pharmacy x 2. Will try back later.

## 2015-06-04 NOTE — Telephone Encounter (Signed)
Call made to pharmacy once again at this time. Tiffany states that due to the type of medication, he will need a new medication if he needs more medication.

## 2015-06-07 ENCOUNTER — Encounter (INDEPENDENT_AMBULATORY_CARE_PROVIDER_SITE_OTHER): Payer: Self-pay

## 2015-06-07 ENCOUNTER — Encounter: Payer: Self-pay | Admitting: Surgery

## 2015-06-07 ENCOUNTER — Ambulatory Visit (INDEPENDENT_AMBULATORY_CARE_PROVIDER_SITE_OTHER): Payer: PRIVATE HEALTH INSURANCE | Admitting: Surgery

## 2015-06-07 VITALS — BP 147/93 | HR 57 | Temp 98.2°F | Ht 75.0 in | Wt 174.0 lb

## 2015-06-07 DIAGNOSIS — K4090 Unilateral inguinal hernia, without obstruction or gangrene, not specified as recurrent: Secondary | ICD-10-CM

## 2015-06-07 NOTE — Patient Instructions (Signed)
Please see the disability note provided.  Call our office with any questions or concerns.

## 2015-06-07 NOTE — Progress Notes (Signed)
The patient is proximal to 3 weeks status post open right inguinal hernia repair with mesh.  He is feeling well. He is no longer taking pain medicine except at night. He is scheduled to go back to work on light duty this coming Monday and a work note was requested.  He feels overall much better. He is having no urinary or GI symptoms. He said no wound drainage. He does not feel that there is significant swelling in the right scrotum.  Physical examination transverse a well-healed incision. There is no evidence of recurrent hernia seroma or evolving hydrocele. Right testicle and spermatic cord was unremarkable to palpation.  Impression doing well 3 weeks status post open right inguinal herniorrhaphy.  He was given a return to work with light duty for this coming Monday. He did not request refill on pain medications. I will see him back in the office as needed.

## 2022-06-17 DEATH — deceased
